# Patient Record
Sex: Male | Born: 1990 | Race: Black or African American | Hispanic: No | Marital: Single | Smoking: Former smoker
Health system: Southern US, Community
[De-identification: ages and names within clinical notes are randomized; demographics above are authoritative.]

## PROBLEM LIST (undated history)

## (undated) DIAGNOSIS — J302 Other seasonal allergic rhinitis: Secondary | ICD-10-CM

---
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
## 2013-10-19 ENCOUNTER — Encounter (HOSPITAL_COMMUNITY): Payer: Self-pay | Admitting: Emergency Medicine

## 2013-10-19 ENCOUNTER — Emergency Department (HOSPITAL_COMMUNITY)
Admission: EM | Admit: 2013-10-19 | Discharge: 2013-10-19 | Disposition: A | Discharge status: Home / Self Care | Payer: Self-pay | Attending: Emergency Medicine | Admitting: Emergency Medicine

## 2013-10-19 DIAGNOSIS — F17200 Nicotine dependence, unspecified, uncomplicated: Secondary | ICD-10-CM | POA: Insufficient documentation

## 2013-10-19 DIAGNOSIS — R369 Urethral discharge, unspecified: Secondary | ICD-10-CM | POA: Insufficient documentation

## 2013-10-19 DIAGNOSIS — R300 Dysuria: Secondary | ICD-10-CM | POA: Insufficient documentation

## 2013-10-19 HISTORY — DX: Other seasonal allergic rhinitis: J30.2

## 2013-10-19 LAB — URINALYSIS, ROUTINE W REFLEX MICROSCOPIC
Bilirubin Urine: NEGATIVE
Glucose, UA: NEGATIVE mg/dL
Hgb urine dipstick: NEGATIVE
Ketones, ur: NEGATIVE mg/dL
Nitrite: NEGATIVE
Protein, ur: NEGATIVE mg/dL
Specific Gravity, Urine: 1.024 (ref 1.005–1.030)
Urobilinogen, UA: 0.2 mg/dL (ref 0.0–1.0)
pH: 6 (ref 5.0–8.0)

## 2013-10-19 LAB — URINE MICROSCOPIC-ADD ON

## 2013-10-19 MED ORDER — AZITHROMYCIN 250 MG PO TABS
1000.0000 mg | ORAL_TABLET | Freq: Once | ORAL | Status: AC
  Administered 2013-10-19: 1000 mg via ORAL
  Filled 2013-10-19: qty 4

## 2013-10-19 MED ORDER — LIDOCAINE HCL (PF) 1 % IJ SOLN
INTRAMUSCULAR | Status: AC
  Administered 2013-10-19: 2.1 mL
  Filled 2013-10-19: qty 5

## 2013-10-19 MED ORDER — CEFTRIAXONE SODIUM 250 MG IJ SOLR
250.0000 mg | Freq: Once | INTRAMUSCULAR | Status: AC
  Administered 2013-10-19: 250 mg via INTRAMUSCULAR
  Filled 2013-10-19: qty 250

## 2013-10-19 NOTE — Discharge Instructions (Signed)
Sexually Transmitted Disease A sexually transmitted disease (STD) is a disease or infection that may be passed (transmitted) from person to person, usually during sexual activity. This may happen by way of saliva, semen, blood, vaginal mucus, or urine. Common STDs include:   Gonorrhea.   Chlamydia.   Syphilis.   HIV and AIDS.   Genital herpes.   Hepatitis B and C.   Trichomonas.   Human papillomavirus (HPV).   Pubic lice.   Scabies.  Mites.  Bacterial vaginosis. WHAT ARE CAUSES OF STDs? An STD may be caused by bacteria, a virus, or parasites. STDs are often transmitted during sexual activity if one person is infected. However, they may also be transmitted through nonsexual means. STDs may be transmitted after:   Sexual intercourse with an infected person.   Sharing sex toys with an infected person.   Sharing needles with an infected person or using unclean piercing or tattoo needles.  Having intimate contact with the genitals, mouth, or rectal areas of an infected person.   Exposure to infected fluids during birth. WHAT ARE THE SIGNS AND SYMPTOMS OF STDs? Different STDs have different symptoms. Some people may not have any symptoms. If symptoms are present, they may include:   Painful or bloody urination.   Pain in the pelvis, abdomen, vagina, anus, throat, or eyes.   Skin rash, itching, irritation, growths, sores (lesions), ulcerations, or warts in the genital or anal area.  Abnormal vaginal discharge with or without bad odor.   Penile discharge in men.   Fever.   Pain or bleeding during sexual intercourse.   Swollen glands in the groin area.   Yellow skin and eyes (jaundice). This is seen with hepatitis.   Swollen testicles.  Infertility.  Sores and blisters in the mouth. HOW ARE STDs DIAGNOSED? To make a diagnosis, your health care provider may:   Take a medical history.   Perform a physical exam.   Take a sample of any  discharge for examination.  Swab the throat, cervix, opening to the penis, rectum, or vagina for testing.  Test a sample of your first morning urine.   Perform blood tests.   Perform a Pap smear, if this applies.   Perform a colposcopy.   Perform a laparoscopy.  HOW ARE STDs TREATED? Treatment depends on the STD. Some STDs may be treated but not cured.   Chlamydia, gonorrhea, trichomonas, and syphilis can be cured with antibiotics.   Genital herpes, hepatitis, and HIV can be treated, but not cured, with prescribed medicines. The medicines lessen symptoms.   Genital warts from HPV can be treated with medicine or by freezing, burning (electrocautery), or surgery. Warts may come back.   HPV cannot be cured with medicine or surgery. However, abnormal areas may be removed from the cervix, vagina, or vulva.   If your diagnosis is confirmed, your recent sexual partners need treatment. This is true even if they are symptom-free or have a negative culture or evaluation. They should not have sex until their health care providers say it is OK. HOW CAN I REDUCE MY RISK OF GETTING AN STD?  Use latex condoms, dental dams, and water-soluble lubricants during sexual activity. Do not use petroleum jelly or oils.  Get vaccinated for HPV and hepatitis. If you have not received these vaccines in the past, talk to your health care provider about whether one or both might be right for you.   Avoid risky sex practices that can break the skin.  WHAT SHOULD   I DO IF I THINK I HAVE AN STD?  See your health care provider.   Inform all sexual partners. They should be tested and treated for any STDs.  Do not have sex until your health care provider says it is OK. WHEN SHOULD I GET HELP? Seek immediate medical care if:  You develop severe abdominal pain.  You are a man and notice swelling or pain in the testicles.  You are a woman and notice swelling or pain in your vagina. Document  Released: 10/26/2002 Document Revised: 05/26/2013 Document Reviewed: 02/23/2013 [employer] Patient Information 2014 [employer].  

## 2013-10-19 NOTE — ED Notes (Signed)
Pt discharged home with all belongings, pt alert and ambulatory upon discharge, no new rx prescribed, pt verbalizes understanding of dc instructions, no narcotics given in ED pt drove self home

## 2013-10-19 NOTE — ED Provider Notes (Signed)
CSN: [id number]     Arrival date & time 10/19/13  1213 History  This chart was scribed for non-physician practitioner, Roxy Horsemanobert Channel Papandrea, PA-C working with Doug SouSam Jacubowitz, MD by Greggory StallionKayla Andersen, ED scribe. This patient was seen in room TR06C/TR06C and the patient's care was started at 12:46 PM.   Chief Complaint  Patient presents with  . Penile Discharge   The history is provided by the patient. No language interpreter was used.   HPI Comments: Lucas Moore is a 23 y.o. male who presents to the Emergency Department complaining of white penile discharge and dysuria that started 3 days ago. Pt was told by his partner that she tested positive for an STD. He has not tried anything for his symptoms. Denies fever, chills, testicular pain.   Past Medical History  Diagnosis Date  . Seasonal allergies    History reviewed. No pertinent past surgical history. No family history on file. History  Substance Use Topics  . Smoking status: Current Every Day Smoker -- 0.25 packs/day    Types: Cigarettes  . Smokeless tobacco: Not on file  . Alcohol Use: Yes     Comment: occasionally    Review of Systems  Constitutional: Negative for fever and chills.  HENT: Negative for congestion.   Eyes: Negative for redness.  Respiratory: Negative for shortness of breath.   Cardiovascular: Negative for chest pain.  Gastrointestinal: Negative for abdominal distention.  Genitourinary: Positive for dysuria and discharge. Negative for testicular pain.  Musculoskeletal: Negative for gait problem.  Neurological: Negative for speech difficulty.  Psychiatric/Behavioral: Negative for confusion.   Allergies  Review of patient's allergies indicates no known allergies.  Home Medications  No current outpatient prescriptions on file.  BP 146/84  Pulse 70  Temp(Src) 97.8 F (36.6 C) (Oral)  Resp 18  Wt 170 lb (77.111 kg)  SpO2 99%  Physical Exam  Nursing note and vitals reviewed. Constitutional: He is oriented  to person, place, and time. He appears well-developed. No distress.  HENT:  Head: Normocephalic and atraumatic.  Eyes: Conjunctivae and EOM are normal.  Cardiovascular: Normal rate and regular rhythm.   Pulmonary/Chest: Effort normal. No stridor. No respiratory distress.  Abdominal: He exhibits no distension.  Genitourinary:  Uncircumcised, no lesions, lumps, or masses of the penis or testicles. Thick, white penile discharge. No other abnormality of the penis or scrotum.    Musculoskeletal: He exhibits no edema.  Neurological: He is alert and oriented to person, place, and time.  Skin: Skin is warm and dry.  Psychiatric: He has a normal mood and affect.    ED Course  Procedures (including critical care time)  DIAGNOSTIC STUDIES: Oxygen Saturation is 99% on RA, normal by my interpretation.    COORDINATION OF CARE: 12:46 PM-Discussed treatment plan which includes STD testing and treatment with pt at bedside and pt agreed to plan.   Labs Review Labs Reviewed  URINALYSIS, ROUTINE W REFLEX MICROSCOPIC - Abnormal; Notable for the following:    APPearance CLOUDY (*)    Leukocytes, UA MODERATE (*)    All other components within normal limits  URINE MICROSCOPIC-ADD ON - Abnormal; Notable for the following:    Bacteria, UA FEW (*)    All other components within normal limits  GC/CHLAMYDIA PROBE AMP   Imaging Review No results found.   EKG Interpretation None      MDM   Final diagnoses:  Penile discharge    Patient with penile discharge.  No ttp of scrotum or testicles.  Treated with azithro and rocephin.  Discharge to home STD precautions and instructions.  I personally performed the services described in this documentation, which was scribed in my presence. The recorded information has been reviewed and is accurate.    Roxy Horseman, PA-C 10/19/13 1425

## 2013-10-19 NOTE — ED Provider Notes (Signed)
Medical screening examination/treatment/procedure(s) were performed by non-physician practitioner and as supervising physician I was immediately available for consultation/collaboration.   EKG Interpretation None       Doug SouSam Julius Matus, MD 10/19/13 1627

## 2013-10-19 NOTE — ED Notes (Signed)
Pt c/o milky white penile discharge X 3 days, was told by his partner that she was tested for STD and she has something. also c/o discomfort with urination. Denies abd pain. Nad, skin warm and dry, resp e/u.

## 2013-10-20 LAB — GC/CHLAMYDIA PROBE AMP
CT Probe RNA: NEGATIVE
GC Probe RNA: POSITIVE — AB

## 2013-10-21 ENCOUNTER — Telehealth (HOSPITAL_COMMUNITY): Payer: Self-pay

## 2013-10-21 NOTE — ED Notes (Signed)
Results received from [facility].  (+) Gonorrhea. Treated with Zithromax and Rocephin.  [employer] form completed and faxed.  10/21/13 @ 9:20 Pt informed of dx, tx rcvd approp. notify partner(s) for testing and tx and abstain from sex x 2 wks.

## 2017-09-16 ENCOUNTER — Emergency Department (HOSPITAL_COMMUNITY)
Admission: EM | Admit: 2017-09-16 | Discharge: 2017-09-16 | Disposition: A | Discharge status: Home / Self Care | Payer: Self-pay | Attending: Emergency Medicine | Admitting: Emergency Medicine

## 2017-09-16 ENCOUNTER — Other Ambulatory Visit: Payer: Self-pay

## 2017-09-16 ENCOUNTER — Encounter (HOSPITAL_COMMUNITY): Payer: Self-pay | Admitting: Emergency Medicine

## 2017-09-16 ENCOUNTER — Emergency Department (HOSPITAL_COMMUNITY): Payer: Self-pay

## 2017-09-16 DIAGNOSIS — F17210 Nicotine dependence, cigarettes, uncomplicated: Secondary | ICD-10-CM | POA: Insufficient documentation

## 2017-09-16 DIAGNOSIS — R410 Disorientation, unspecified: Secondary | ICD-10-CM | POA: Insufficient documentation

## 2017-09-16 LAB — CBC
HEMATOCRIT: 46.5 % (ref 39.0–52.0)
HEMOGLOBIN: 15.6 g/dL (ref 13.0–17.0)
MCH: 27.9 pg (ref 26.0–34.0)
MCHC: 33.5 g/dL (ref 30.0–36.0)
MCV: 83.2 fL (ref 78.0–100.0)
Platelets: 201 10*3/uL (ref 150–400)
RBC: 5.59 MIL/uL (ref 4.22–5.81)
RDW: 14.7 % (ref 11.5–15.5)
WBC: 10.2 10*3/uL (ref 4.0–10.5)

## 2017-09-16 LAB — COMPREHENSIVE METABOLIC PANEL
ALBUMIN: 4.7 g/dL (ref 3.5–5.0)
ALT: 21 U/L (ref 17–63)
ANION GAP: 9 (ref 5–15)
AST: 28 U/L (ref 15–41)
Alkaline Phosphatase: 48 U/L (ref 38–126)
BUN: 13 mg/dL (ref 6–20)
CHLORIDE: 104 mmol/L (ref 101–111)
CO2: 25 mmol/L (ref 22–32)
Calcium: 9.7 mg/dL (ref 8.9–10.3)
Creatinine, Ser: 1.55 mg/dL — ABNORMAL HIGH (ref 0.61–1.24)
GFR calc Af Amer: >60 mL/min (ref >60)
GFR calc non Af Amer: 59 mL/min — ABNORMAL LOW (ref >60)
GLUCOSE: 102 mg/dL — AB (ref 65–99)
POTASSIUM: 4.1 mmol/L (ref 3.5–5.1)
SODIUM: 138 mmol/L (ref 135–145)
Total Bilirubin: 1 mg/dL (ref 0.3–1.2)
Total Protein: 7.4 g/dL (ref 6.5–8.1)

## 2017-09-16 LAB — URINALYSIS, ROUTINE W REFLEX MICROSCOPIC
BILIRUBIN URINE: NEGATIVE
Glucose, UA: NEGATIVE mg/dL
HGB URINE DIPSTICK: NEGATIVE
Ketones, ur: NEGATIVE mg/dL
Leukocytes, UA: NEGATIVE
NITRITE: NEGATIVE
PROTEIN: NEGATIVE mg/dL
Specific Gravity, Urine: 1.019 (ref 1.005–1.030)
pH: 5 (ref 5.0–8.0)

## 2017-09-16 LAB — RAPID URINE DRUG SCREEN, HOSP PERFORMED
AMPHETAMINES: NOT DETECTED
Barbiturates: NOT DETECTED
Benzodiazepines: POSITIVE — AB
Cocaine: NOT DETECTED
Opiates: NOT DETECTED
TETRAHYDROCANNABINOL: POSITIVE — AB

## 2017-09-16 LAB — AMMONIA: Ammonia: 26 umol/L (ref 9–35)

## 2017-09-16 LAB — ETHANOL: Alcohol, Ethyl (B): <10 mg/dL (ref <10)

## 2017-09-16 LAB — INFLUENZA PANEL BY PCR (TYPE A & B)
Influenza A By PCR: NEGATIVE
Influenza B By PCR: NEGATIVE

## 2017-09-16 LAB — RAPID STREP SCREEN (MED CTR MEBANE ONLY): Streptococcus, Group A Screen (Direct): NEGATIVE

## 2017-09-16 MED ORDER — SODIUM CHLORIDE 0.9 % IV BOLUS (SEPSIS)
1000.0000 mL | Freq: Once | INTRAVENOUS | Status: AC
  Administered 2017-09-16: 1000 mL via INTRAVENOUS

## 2017-09-16 NOTE — ED Notes (Signed)
Pt taken to CT.

## 2017-09-16 NOTE — ED Notes (Signed)
Pt and visitor arguing in the hall, advised that other pt's are here and they need to be quiet. Pt becoming increasingly agitated, security and [employer] present. Pt and visitor exited Pod A to the waiting room.

## 2017-09-16 NOTE — ED Provider Notes (Signed)
[facility] EMERGENCY DEPARTMENT Provider Note   CSN: [id number] Arrival date & time: 09/16/17  0241     History   Chief Complaint Chief Complaint  Patient presents with  . Altered Mental Status   Level 5 caveat due to altered mental status HPI Lucas Moore is a 27 y.o. male with no significant past medical history presents today for evaluation of altered mental status.  He was brought in by his girlfriend who states that she was unable to get a hold of him all day yesterday (Monday).  She states that they spoke on the phone Sunday night into Monday morning from around 2 AM to 5 AM.  She states that she then went to sleep and awoke at around 11 PM and was unable to get a hold of the patient.  She went over to his home at around 2 AM and was let in by his roommates and was found to be sleeping in his bed.  She states that he was "staggering around and slurring his speech "which concerned her.  He told her that he had one beer but denied any other alcohol or drug use.  She states that she last saw him normal Sunday evening.  Patient is complaining of a headache and sore throat but is unable to tell me how long this is been going on for.  He states "I have headaches a lot ".  He states he took an ibuprofen hours ago with some relief of his symptoms.  He denies any recreational drug use but endorses drinking 1 beer.  Denies any recent travel.  She is girlfriend states that she is unsure if he had taken any other drugs.  The history is provided by the patient.    Past Medical History:  Diagnosis Date  . Seasonal allergies     There are no active problems to display for this patient.   History reviewed. No pertinent surgical history.     Home Medications    Prior to Admission medications   Medication Sig Start Date End Date Taking? Authorizing Provider  ibuprofen (ADVIL,MOTRIN) 200 MG tablet Take 400 mg by mouth every 6 (six) hours as needed for mild pain.   Yes  [provider]    Family History No family history on file.  Social History Social History   Tobacco Use  . Smoking status: Current Every Day Smoker    Packs/day: 0.25    Types: Cigarettes  . Smokeless tobacco: Never Used  Substance Use Topics  . Alcohol use: Yes    Comment: occasionally  . Drug use: Yes    Types: Marijuana    Comment: occasionally     Allergies   Patient has no known allergies.   Review of Systems Review of Systems  Constitutional: Negative for chills and fever.  HENT: Positive for sore throat.   Respiratory: Negative for shortness of breath.   Cardiovascular: Negative for chest pain.  Gastrointestinal: Negative for abdominal pain.  Neurological: Positive for headaches. Negative for syncope, weakness and numbness.  Psychiatric/Behavioral: Positive for confusion.  All other systems reviewed and are negative.    Physical Exam Updated Vital Signs BP (!) 147/106   Pulse 88   Temp 98.6 F (37 C) (Oral)   Resp 14   SpO2 96%   Physical Exam  Constitutional: He appears well-developed and well-nourished. No distress.  Resting in bed, appears somewhat sleepy  HENT:  Head: Normocephalic and atraumatic.  TMs without erythema or  bulging bilaterally.  No frontal or maxillary sinus tenderness.  Nasal septum midline.  Posterior oropharynx somewhat difficult to visualize but uvula is midline.  There is mild tonsillar erythema but no significant hypertrophy or exudates.  No facial swelling noted.  No drooling.  Eyes: EOM are normal. Pupils are equal, round, and reactive to light. Right eye exhibits no discharge. Left eye exhibits no discharge.  Mildly injected conjunctiva bilaterally  Neck: Normal range of motion. Neck supple. No JVD present. No tracheal deviation present.  Cardiovascular: Regular rhythm and normal heart sounds.  Tachycardic, 2+ radial and DP/PT pulses bl, negative Homan's bl , no lower extremity edema  Pulmonary/Chest: Effort  normal and breath sounds normal.  Abdominal: Soft. Bowel sounds are normal. He exhibits no distension. There is no tenderness. There is no guarding.  Musculoskeletal: Normal range of motion. He exhibits no edema or tenderness.  No midline spine TTP, no paraspinal muscle tenderness, no deformity, crepitus, or step-off noted.  5/5 strength of BUE and BLE major muscle groups.  No deformity, crepitus, erythema, or swelling noted on palpation of the extremities.  Neurological: He is alert. No cranial nerve deficit or sensory deficit. Coordination abnormal.  Mildly dysarthric speech.  Alert and oriented to person and place but not time. No facial droop.  No nystagmus.  No pronator drift. Cranial nerves II through XII tested and intact. 5/5 strength of BUE and BLE major muscle groups.  He is exhibiting good muscle tone.  Sensation intact to soft touch of extremities and face. Unsteady gait, patient unable to Heel Walk and Toe Walk.  Romberg sign present. No Clonus, no hyperreflexia.   Skin: Skin is warm and dry. No erythema.  Psychiatric: He has a normal mood and affect. His behavior is normal.  Nursing note and vitals reviewed.    ED Treatments / Results  Labs (all labs ordered are listed, but only abnormal results are displayed) Labs Reviewed  COMPREHENSIVE METABOLIC PANEL - Abnormal; Notable for the following components:      Result Value   Glucose, Bld 102 (*)    Creatinine, Ser 1.55 (*)    GFR calc non Af Amer 59 (*)    All other components within normal limits  RAPID URINE DRUG SCREEN, HOSP PERFORMED - Abnormal; Notable for the following components:   Benzodiazepines POSITIVE (*)    Tetrahydrocannabinol POSITIVE (*)    All other components within normal limits  URINALYSIS, ROUTINE W REFLEX MICROSCOPIC - Abnormal; Notable for the following components:   APPearance TURBID (*)    Bacteria, UA RARE (*)    Squamous Epithelial / LPF 0-5 (*)    All other components within normal limits    RAPID STREP SCREEN (NOT AT [facility])  CULTURE, GROUP A STREP [facility])  CBC  ETHANOL  AMMONIA  INFLUENZA PANEL BY PCR (TYPE A & B)    EKG  EKG Interpretation  Date/Time:  Tuesday September 16 2017 02:46:37 EST Ventricular Rate:  110 PR Interval:  154 QRS Duration: 86 QT Interval:  298 QTC Calculation: 403 R Axis:   27 Text Interpretation:  Sinus tachycardia Cannot rule out Anterior infarct , age undetermined Abnormal ECG No previous ECGs available Confirmed by Alvira Monday ([id number]) on 09/16/2017 6:58:56 AM       Radiology Dg Chest 2 View  Result Date: 09/16/2017 CLINICAL DATA:  Altered mental status and tachypnea EXAM: CHEST  2 VIEW COMPARISON:  None. FINDINGS: Cardiac shadow is enlarged but accentuated by the frontal technique. The lungs are  hypoinflated without focal infiltrate. No sizable effusion is seen. No bony abnormality is noted. IMPRESSION: No acute abnormality noted. Electronically Signed   By: Alcide Clever M.D.   On: 09/16/2017 14:24   Ct Head Wo Contrast  Result Date: 09/16/2017 CLINICAL DATA:  Altered level of consciousness EXAM: CT HEAD WITHOUT CONTRAST TECHNIQUE: Contiguous axial images were obtained from the base of the skull through the vertex without intravenous contrast. COMPARISON:  None. FINDINGS: Brain: No evidence of acute infarction, hemorrhage, hydrocephalus, extra-axial collection or mass lesion/mass effect. Vascular: No hyperdense vessel or unexpected calcification. Skull: Normal. Negative for fracture or focal lesion. Sinuses/Orbits: The visualized paranasal sinuses are essentially clear. The mastoid air cells are unopacified. Other: None. IMPRESSION: Normal head CT. Electronically Signed   By: Charline Bills M.D.   On: 09/16/2017 09:52    Procedures Procedures (including critical care time)  Medications Ordered in ED Medications  sodium chloride 0.9 % bolus 1,000 mL (0 mLs Intravenous Stopped 09/16/17 1136)  sodium chloride 0.9 % bolus 1,000 mL (0 mLs  Intravenous Stopped 09/16/17 1230)     Initial Impression / Assessment and Plan / ED Course  I have reviewed the triage vital signs and the nursing notes.  Pertinent labs & imaging results that were available during my care of the patient were reviewed by me and considered in my medical decision making (see chart for details).     Patient presents with acute onset of altered mental status at some point between the hours of 5 AM Monday morning and 2 AM Tuesday morning.  Low-grade fever initially with improvement while in the ED.  He was also initially tachycardic which improved while in the ED.  He was intermittently tachypneic while in the ED.  Initially speech was dysarthric and he was sleepy but easily arousable.  He was slow to follow commands, but comprehended them without difficulty.  No leukocytosis, mildly elevated creatinine but otherwise lab work reassuring.  Ammonia is within normal limits.  Patient was complaining of pain to his throat but examination is not concerning for strep pharyngitis or PTA.  Airway is intact.  Rapid strep is negative.  Influenza panel is negative.  Undetectable ethanol levels in the ED. Imaging of the head and chest show no acute abnormalities. UDS is positive for benzodiazepines and THC.  11:19 AM On reassessment, patient is sleepy but easily arousable.  Mildly tachypneic while sleeping.  Patient's girlfriend states that he admitted to her that he took "something ", but would not disclose what substance he ingested or how much.  She states he has not expressed feelings of depression or suicidal ideation to her and she does not think that he ingested anything in an attempt to intentionally overdose.  Per RN, patient ripped out his IV earlier and was attempting to ambulate around the room.  A second IV was successfully placed and he is receiving second liter of normal saline. 3:25 PM On reevaluation, patient is alert and oriented.  He is speaking in full sentences  without difficulty and no evidence of dysarthria.  He is ambulatory without difficulty and able to walk on his heels and toes with normal balance.  He states that he had half a bar of Xanax yesterday at home as well as one beer.  He states he also smoked marijuana.  He denies any complaints at this time.  Vital signs are stable and within normal limits.  Suspects transient altered mental status secondary to Xanax use coupled with alcohol and  marijuana use.  Advised patient to use warm water salt gargles, warm teas, throat lozenges for his throat pain.  Discussed indications for return to the ED as well as indications for reevaluation by primary care physician.  Patient and patient's girlfriend verbalized understanding of and agreement with plan and patient is stable for discharge at this time. Final Clinical Impressions(s) / ED Diagnoses   Final diagnoses:  Disorientation    ED Discharge Orders    None       Jeanie SewerFawze, Maytte Jacot A, PA-C 09/16/17 1552    Little, Ambrose Finlandachel Morgan, MD 09/20/17 1318

## 2017-09-16 NOTE — ED Notes (Signed)
PA at the bedside.

## 2017-09-16 NOTE — ED Notes (Signed)
Pt is eating at this time with girlfriends assistance.

## 2017-09-16 NOTE — ED Notes (Signed)
Pt provided with food and drink at bedside.  Will encourage po challenge upon arrival to room from radiology

## 2017-09-16 NOTE — ED Notes (Signed)
Pt ambulated in bedroom with ease. He appeared sleepy at times but not unbalanced or weak. Pt is dressed and ready for discharge.

## 2017-09-16 NOTE — Discharge Instructions (Signed)
Drink plenty of fluids and get plenty of rest.  Continue to be monitored by friends and family for the next 24 hours.  Return to the emergency department if any concerning signs or symptoms develop such as fever, seizures, or worsening mental status.

## 2017-09-16 NOTE — ED Notes (Signed)
Family at bedside made aware to use call light to contact tech/RN when Pt wakes up. She was told that we are encouraging fluids and how well he is able to ambulate. Pt is still sleeping at this time, but his breathing was noticed to be loud and somewhat rapid (22-24 breaths/min).

## 2017-09-16 NOTE — ED Triage Notes (Signed)
Pt BIB girlfriend for AMS. Girlfriend reports that she hasn't been able to get a hold of pt today and that's not normal. Pt admits to drinking 2 beers and smoking weed. Pt answers questions appropriately, slow to respond.

## 2017-09-16 NOTE — ED Notes (Signed)
Patient transported to X-ray 

## 2017-09-16 NOTE — ED Notes (Signed)
Pt admits to this RN and ED Provider that he took "half a bar" of xanax and smoked weed yesterday.  Pt answers questions appropriately.  VSS.

## 2017-09-18 LAB — CULTURE, GROUP A STREP (THRC)

## 2017-11-22 ENCOUNTER — Emergency Department (HOSPITAL_COMMUNITY)
Admission: EM | Admit: 2017-11-22 | Discharge: 2017-11-22 | Disposition: A | Discharge status: Home / Self Care | Payer: Self-pay | Attending: Emergency Medicine | Admitting: Emergency Medicine

## 2017-11-22 ENCOUNTER — Other Ambulatory Visit: Payer: Self-pay

## 2017-11-22 ENCOUNTER — Encounter (HOSPITAL_COMMUNITY): Payer: Self-pay

## 2017-11-22 DIAGNOSIS — E860 Dehydration: Secondary | ICD-10-CM | POA: Insufficient documentation

## 2017-11-22 DIAGNOSIS — R112 Nausea with vomiting, unspecified: Secondary | ICD-10-CM

## 2017-11-22 DIAGNOSIS — Z87891 Personal history of nicotine dependence: Secondary | ICD-10-CM | POA: Insufficient documentation

## 2017-11-22 DIAGNOSIS — R197 Diarrhea, unspecified: Secondary | ICD-10-CM | POA: Insufficient documentation

## 2017-11-22 DIAGNOSIS — R109 Unspecified abdominal pain: Secondary | ICD-10-CM | POA: Insufficient documentation

## 2017-11-22 DIAGNOSIS — Z79899 Other long term (current) drug therapy: Secondary | ICD-10-CM | POA: Insufficient documentation

## 2017-11-22 LAB — COMPREHENSIVE METABOLIC PANEL
ALK PHOS: 45 U/L (ref 38–126)
ALT: 20 U/L (ref 17–63)
ANION GAP: 9 (ref 5–15)
AST: 23 U/L (ref 15–41)
Albumin: 4.8 g/dL (ref 3.5–5.0)
BILIRUBIN TOTAL: 0.9 mg/dL (ref 0.3–1.2)
BUN: 13 mg/dL (ref 6–20)
CALCIUM: 9.8 mg/dL (ref 8.9–10.3)
CO2: 31 mmol/L (ref 22–32)
Chloride: 98 mmol/L — ABNORMAL LOW (ref 101–111)
Creatinine, Ser: 1.36 mg/dL — ABNORMAL HIGH (ref 0.61–1.24)
GFR calc non Af Amer: >60 mL/min (ref >60)
GLUCOSE: 165 mg/dL — AB (ref 65–99)
Potassium: 3.7 mmol/L (ref 3.5–5.1)
Sodium: 138 mmol/L (ref 135–145)
TOTAL PROTEIN: 8 g/dL (ref 6.5–8.1)

## 2017-11-22 LAB — URINALYSIS, ROUTINE W REFLEX MICROSCOPIC
BILIRUBIN URINE: NEGATIVE
Glucose, UA: 100 mg/dL — AB
Hgb urine dipstick: NEGATIVE
KETONES UR: NEGATIVE mg/dL
Leukocytes, UA: NEGATIVE
NITRITE: NEGATIVE
PROTEIN: 30 mg/dL — AB
SPECIFIC GRAVITY, URINE: 1.025 (ref 1.005–1.030)
pH: 7 (ref 5.0–8.0)

## 2017-11-22 LAB — CBC
HCT: 49.1 % (ref 39.0–52.0)
HEMOGLOBIN: 16.9 g/dL (ref 13.0–17.0)
MCH: 28.6 pg (ref 26.0–34.0)
MCHC: 34.4 g/dL (ref 30.0–36.0)
MCV: 83.2 fL (ref 78.0–100.0)
Platelets: 202 10*3/uL (ref 150–400)
RBC: 5.9 MIL/uL — AB (ref 4.22–5.81)
RDW: 14.1 % (ref 11.5–15.5)
WBC: 7.9 10*3/uL (ref 4.0–10.5)

## 2017-11-22 LAB — URINALYSIS, MICROSCOPIC (REFLEX)
Bacteria, UA: NONE SEEN
RBC / HPF: NONE SEEN RBC/hpf (ref 0–5)
WBC UA: NONE SEEN WBC/hpf (ref 0–5)

## 2017-11-22 LAB — LIPASE, BLOOD: Lipase: 22 U/L (ref 11–51)

## 2017-11-22 MED ORDER — ONDANSETRON 4 MG PO TBDP
4.0000 mg | ORAL_TABLET | Freq: Once | ORAL | Status: AC | PRN
  Administered 2017-11-22: 4 mg via ORAL
  Filled 2017-11-22: qty 1

## 2017-11-22 MED ORDER — GI COCKTAIL ~~LOC~~
30.0000 mL | Freq: Once | ORAL | Status: AC
  Administered 2017-11-22: 30 mL via ORAL
  Filled 2017-11-22: qty 30

## 2017-11-22 MED ORDER — SODIUM CHLORIDE 0.9 % IV SOLN
Freq: Once | INTRAVENOUS | Status: AC
Start: 2017-11-22 — End: 2017-11-22
  Administered 2017-11-22: 14:00:00 via INTRAVENOUS

## 2017-11-22 MED ORDER — ONDANSETRON HCL 4 MG/2ML IJ SOLN
4.0000 mg | Freq: Once | INTRAMUSCULAR | Status: AC
  Administered 2017-11-22: 4 mg via INTRAVENOUS
  Filled 2017-11-22: qty 2

## 2017-11-22 MED ORDER — MORPHINE SULFATE (PF) 4 MG/ML IV SOLN
4.0000 mg | Freq: Once | INTRAVENOUS | Status: AC
  Administered 2017-11-22: 4 mg via INTRAVENOUS
  Filled 2017-11-22: qty 1

## 2017-11-22 MED ORDER — ONDANSETRON HCL 4 MG PO TABS: 4.0000 mg | ORAL_TABLET | Freq: Four times a day (QID) | ORAL | 0 refills | Status: DC

## 2017-11-22 NOTE — ED Triage Notes (Signed)
Per EMS-Patient c/o N/v/d x 2 days, and left upper abdominal pain since yesterday.

## 2017-11-22 NOTE — ED Notes (Signed)
I have instructed pt that we need to collect a urine sample and if pt needs to use restroom pt is to inform RN or NT.

## 2017-11-22 NOTE — ED Provider Notes (Signed)
[facility]-EMERGENCY DEPT Provider Note   CSN: [id number] Arrival date & time: 11/22/17  1252     History   Chief Complaint Chief Complaint  Patient presents with  . Emesis  . Diarrhea  . Abdominal Pain    HPI Lucas Moore is a 27 y.o. male.  The history is provided by the patient.  Emesis   This is a new problem. The current episode started 2 days ago. The problem occurs 2 to 4 times per day. The problem has been gradually worsening. The emesis has an appearance of stomach contents. There has been no fever. Associated symptoms include abdominal pain and diarrhea. Risk factors: no ill contacts or bad food intake.  Diarrhea   Associated symptoms include abdominal pain and vomiting.  Abdominal Pain   Associated symptoms include diarrhea and vomiting.    Past Medical History:  Diagnosis Date  . Seasonal allergies     There are no active problems to display for this patient.   History reviewed. No pertinent surgical history.      Home Medications    Prior to Admission medications   Medication Sig Start Date End Date Taking? Authorizing Provider  ibuprofen (ADVIL,MOTRIN) 200 MG tablet Take 400 mg by mouth every 6 (six) hours as needed for mild pain.    [provider]    Family History History reviewed. No pertinent family history.  Social History Social History   Tobacco Use  . Smoking status: Former Smoker    Packs/day: 0.25    Types: Cigarettes  . Smokeless tobacco: Never Used  Substance Use Topics  . Alcohol use: Yes    Comment: occasionally  . Drug use: Yes    Types: Marijuana    Comment: occasionally     Allergies   Patient has no known allergies.   Review of Systems Review of Systems  Gastrointestinal: Positive for abdominal pain, diarrhea and vomiting.  All other systems reviewed and are negative.    Physical Exam Updated Vital Signs BP (!) 152/106 (BP Location: Left Arm)   Pulse 69   Temp 98.4 F  (36.9 C) (Oral)   Resp 18   SpO2 97%   Physical Exam  Constitutional: He is oriented to person, place, and time. He appears well-developed and well-nourished. No distress.  HENT:  Head: Normocephalic and atraumatic.  Mouth/Throat: Oropharynx is clear and moist. Mucous membranes are dry.  Eyes: Pupils are equal, round, and reactive to light. Conjunctivae and EOM are normal.  Neck: Normal range of motion. Neck supple.  Cardiovascular: Normal rate, regular rhythm and intact distal pulses.  No murmur heard. Pulmonary/Chest: Effort normal and breath sounds normal. No respiratory distress. He has no wheezes. He has no rales.  Abdominal: Soft. He exhibits no distension. There is tenderness in the left upper quadrant. There is guarding. There is no rebound.  Musculoskeletal: Normal range of motion. He exhibits no edema or tenderness.  Neurological: He is alert and oriented to person, place, and time.  Skin: Skin is warm and dry. No rash noted. No erythema.  Psychiatric: He has a normal mood and affect. His behavior is normal.  Nursing note and vitals reviewed.    ED Treatments / Results  Labs (all labs ordered are listed, but only abnormal results are displayed) Labs Reviewed  COMPREHENSIVE METABOLIC PANEL - Abnormal; Notable for the following components:      Result Value   Chloride 98 (*)    Glucose, Bld 165 (*)  Creatinine, Ser 1.36 (*)    All other components within normal limits  CBC - Abnormal; Notable for the following components:   RBC 5.90 (*)    All other components within normal limits  LIPASE, BLOOD  URINALYSIS, ROUTINE W REFLEX MICROSCOPIC    EKG None  Radiology No results found.  Procedures Procedures (including critical care time)  Medications Ordered in ED Medications  ondansetron (ZOFRAN-ODT) disintegrating tablet 4 mg (4 mg Oral Given 11/22/17 1309)  ondansetron (ZOFRAN) injection 4 mg (4 mg Intravenous Given 11/22/17 1337)  morphine 4 MG/ML injection 4  mg (4 mg Intravenous Given 11/22/17 1337)  0.9 %  sodium chloride infusion ( Intravenous New Bag/Given 11/22/17 1337)     Initial Impression / Assessment and Plan / ED Course  I have reviewed the triage vital signs and the nursing notes.  Pertinent labs & imaging results that were available during my care of the patient were reviewed by me and considered in my medical decision making (see chart for details).     Patient presenting today with abdominal pain, nausea, vomiting and diarrhea for the last 2-3 days.  Patient is having left upper quadrant pain on exam.  He is having no lower abdominal pain concerning for appendicitis at this time.  He denies any fever.  Patient does drink 1-2 beers at most a day but denies any purge drinking prior to his symptoms starting.  Patient is unable to hold anything down.  Concern for possible pancreatitis versus gastroenteritis versus gastritis.  Low suspicion for cholecystitis or diverticulitis.  Patient given IV fluids, pain and nausea medication.  CBC, CMP, lipase pending  3:02 PM Labs are within normal limits.  Patient's symptoms are improving but still has some mild left upper quadrant pain.  Patient given GI cocktail.  Feel most likely this is viral in nature as patient's son had similar symptoms right before he became ill.  Patient is tolerating p.o.'s.  Final Clinical Impressions(s) / ED Diagnoses   Final diagnoses:  Dehydration  Nausea vomiting and diarrhea    ED Discharge Orders        Ordered    ondansetron (ZOFRAN) 4 MG tablet  Every 6 hours     11/22/17 1504       Gwyneth SproutPlunkett, Kennady Zimmerle, MD 11/22/17 1504

## 2019-04-03 IMAGING — CT CT HEAD W/O CM
3 series · 15 of 47 positions shown, 18 images · non-contrast
Comparison: None.

CLINICAL DATA: Altered level of consciousness

EXAM:
CT HEAD WITHOUT CONTRAST
TECHNIQUE: Contiguous axial images were obtained from the base of the skull
through the vertex without intravenous contrast.

[Series 3: head 5.0 h30s · axial · 0.44mm/px · z∈[-106,+29]mm · 9 of 33 slices shown, 12 images]
[im 3/33  brain]
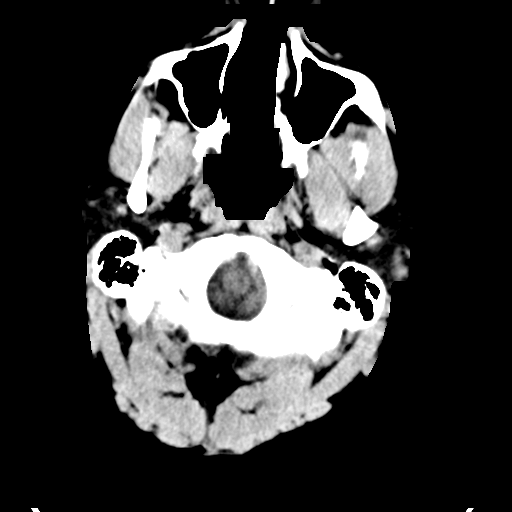
[im 3/33  bone]
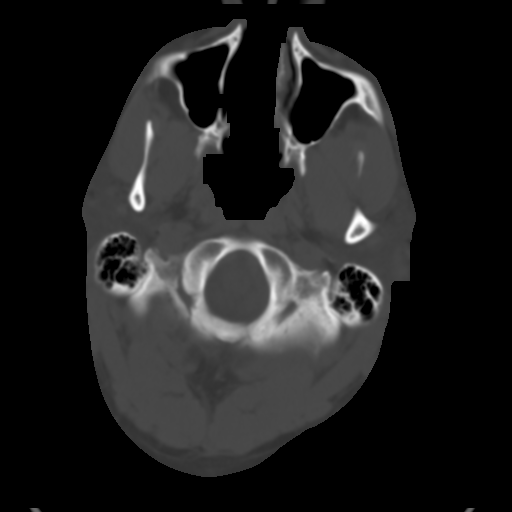
[im 6/33  brain]
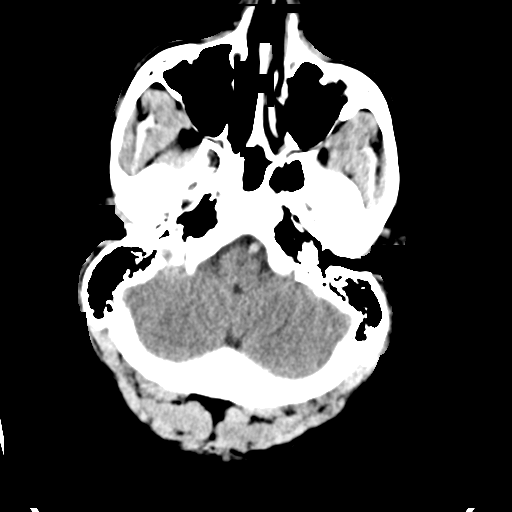
[im 9/33  brain]
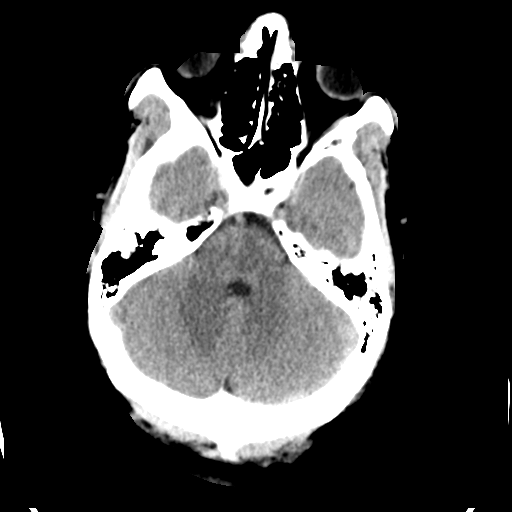
[im 13/33  brain]
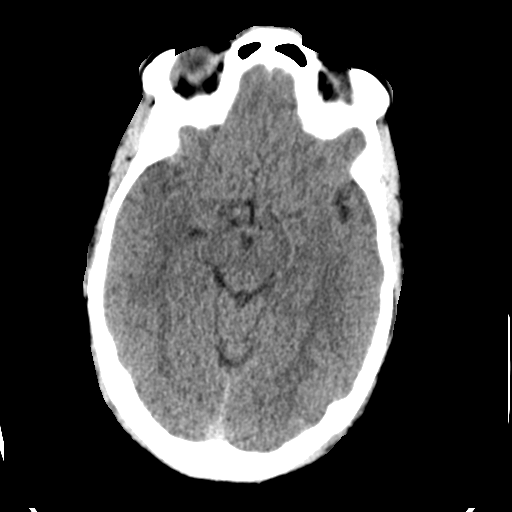
[im 17/33  brain]
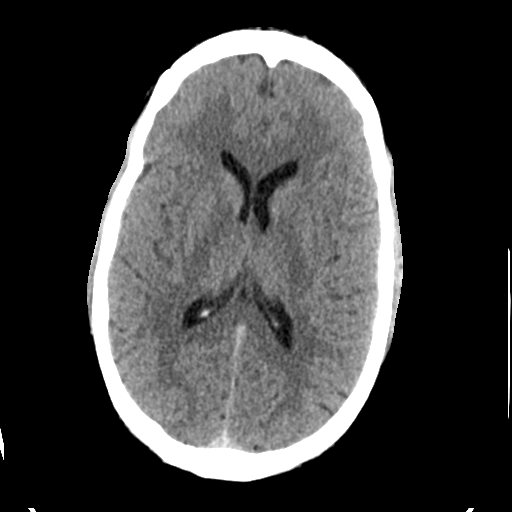
[im 17/33  bone]
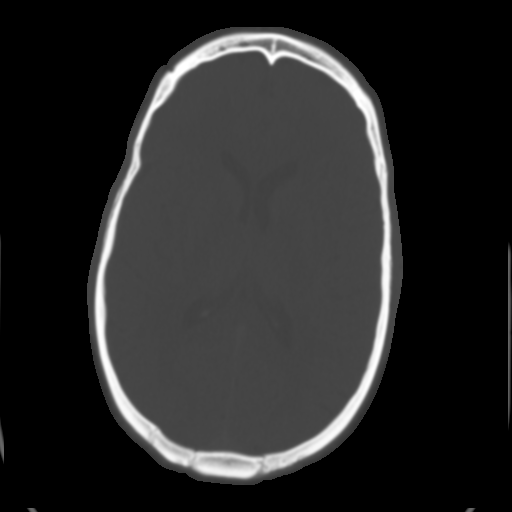
[im 20/33  brain]
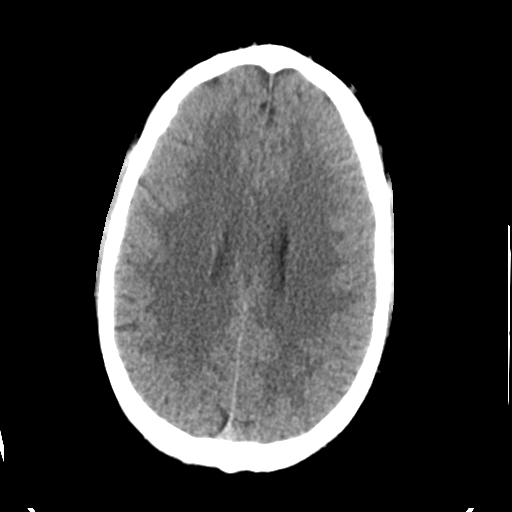
[im 24/33  brain]
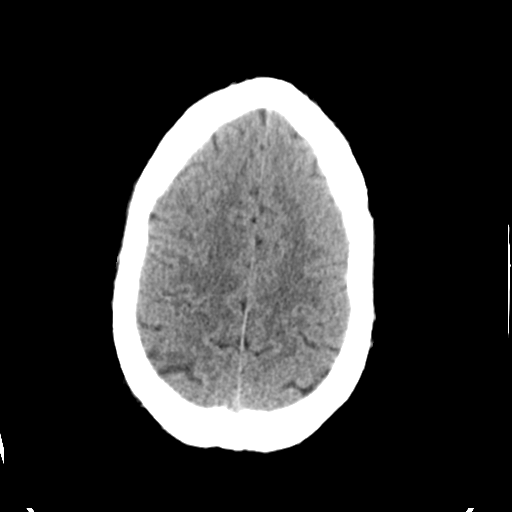
[im 27/33  brain]
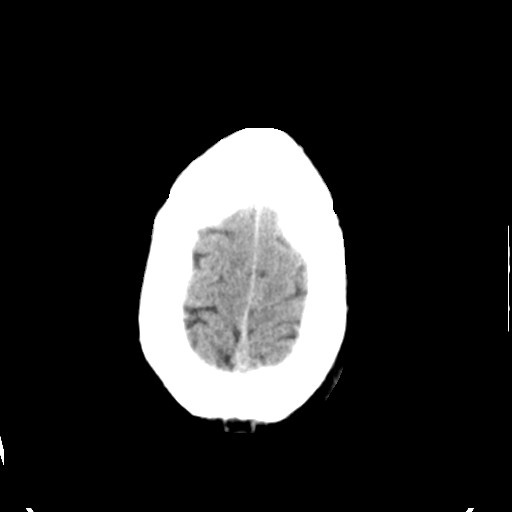
[im 30/33  brain]
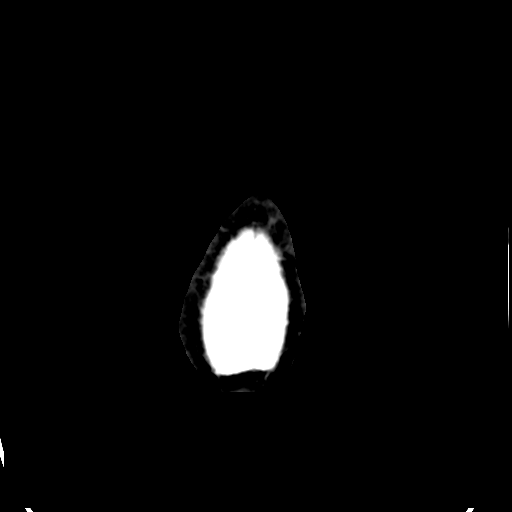
[im 30/33  bone]
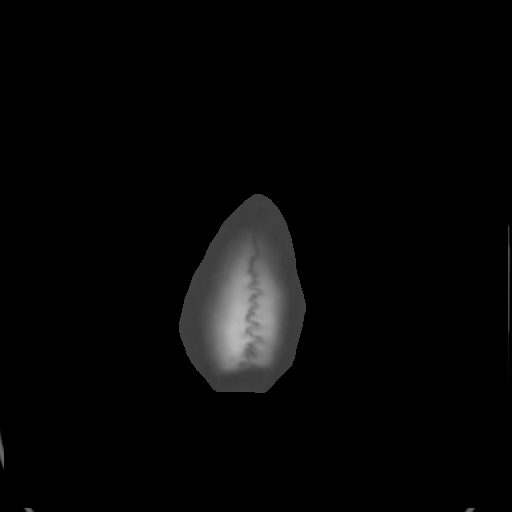

[Series 5: head 3.0 mpr cor · coronal · 0.31mm/px · 3 of 71 slices shown]
[im 24/71  brain]
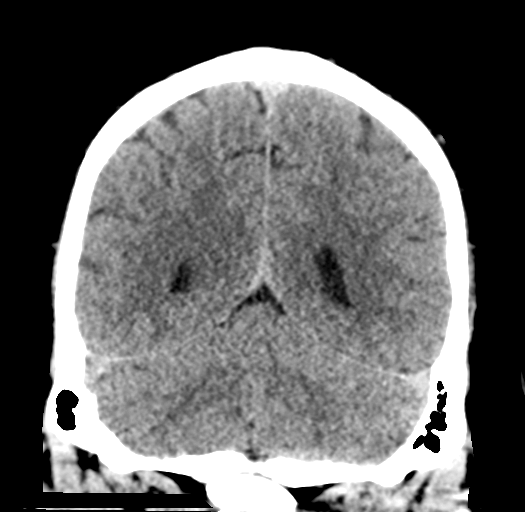
[im 32/71  brain]
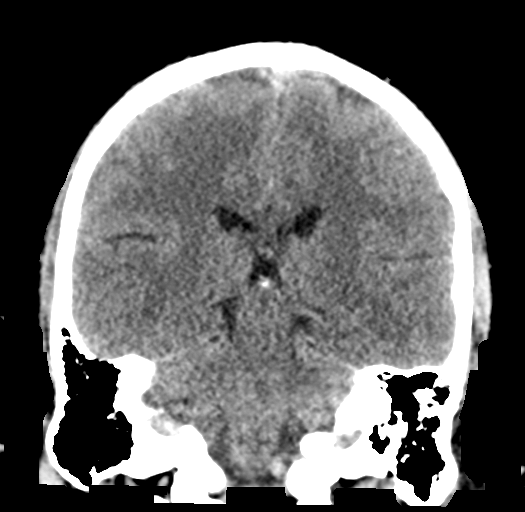
[im 39/71  brain]
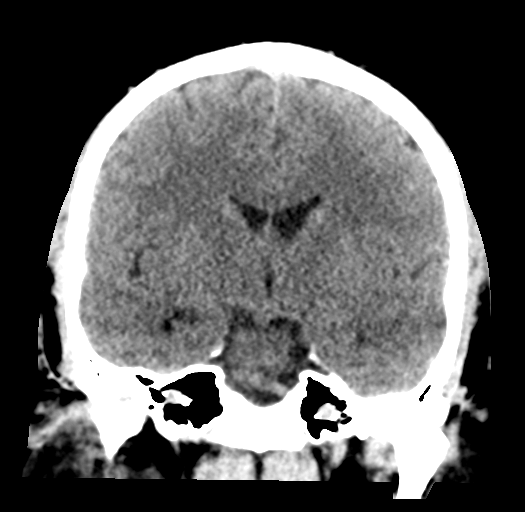

[Series 6: head 3.0 mpr sag · sagittal · 0.31mm/px · 3 of 58 slices shown]
[im 20/58  brain]
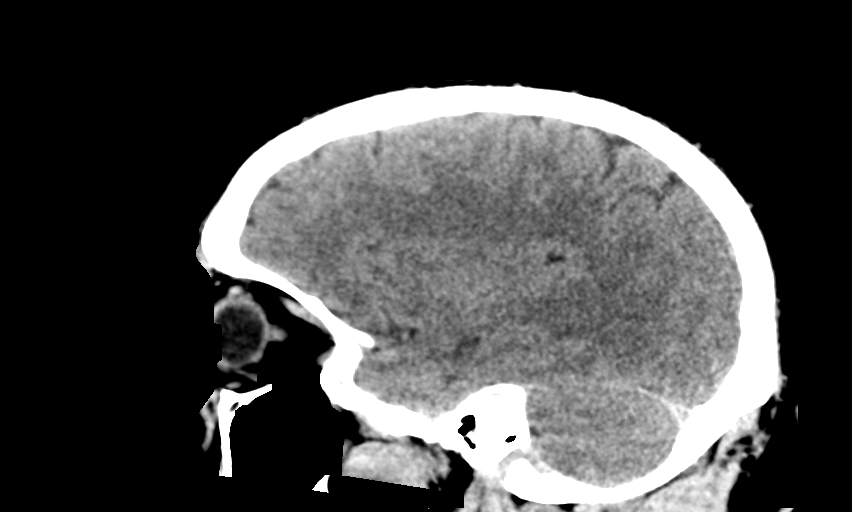
[im 29/58  brain]
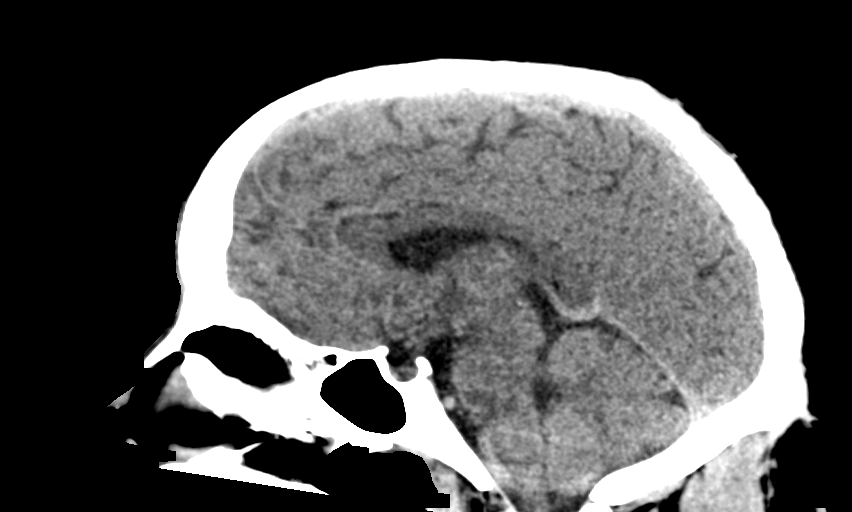
[im 39/58  brain]
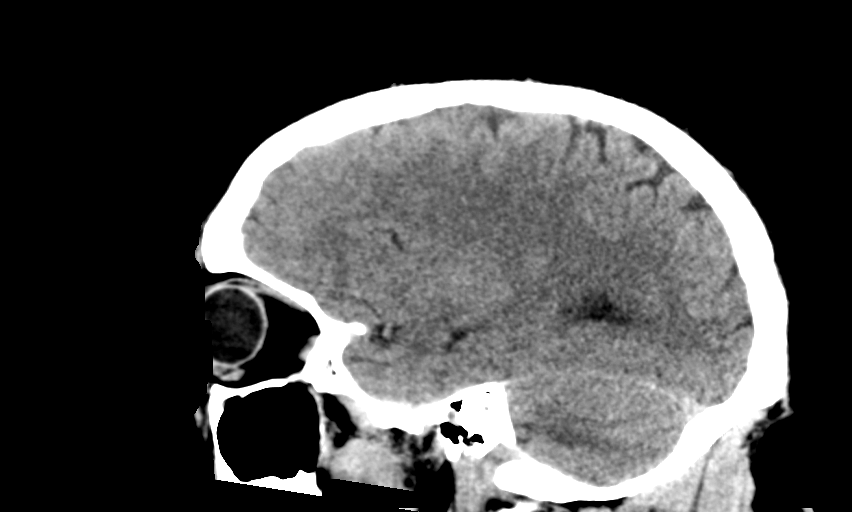

[15 of 47 positions shown; findings below may reference images not displayed]

FINDINGS: Brain: No evidence of acute infarction, hemorrhage, hydrocephalus,
extra-axial collection or mass lesion/mass effect.

Vascular: No hyperdense vessel or unexpected calcification.

Skull: Normal. Negative for fracture or focal lesion.

Sinuses/Orbits: The visualized paranasal sinuses are essentially
clear. The mastoid air cells are unopacified.

Other: None.
IMPRESSION: Normal head CT.

## 2020-08-08 ENCOUNTER — Emergency Department (HOSPITAL_COMMUNITY)
Admission: EM | Admit: 2020-08-08 | Discharge: 2020-08-08 | Disposition: A | Discharge status: Home / Self Care | Payer: Self-pay | Attending: Emergency Medicine | Admitting: Emergency Medicine

## 2020-08-08 ENCOUNTER — Encounter (HOSPITAL_COMMUNITY): Payer: Self-pay | Admitting: Emergency Medicine

## 2020-08-08 ENCOUNTER — Other Ambulatory Visit: Payer: Self-pay

## 2020-08-08 DIAGNOSIS — Z5321 Procedure and treatment not carried out due to patient leaving prior to being seen by health care provider: Secondary | ICD-10-CM | POA: Insufficient documentation

## 2020-08-08 DIAGNOSIS — Z20822 Contact with and (suspected) exposure to covid-19: Secondary | ICD-10-CM | POA: Insufficient documentation

## 2020-08-08 DIAGNOSIS — R6883 Chills (without fever): Secondary | ICD-10-CM | POA: Insufficient documentation

## 2020-08-08 DIAGNOSIS — J029 Acute pharyngitis, unspecified: Secondary | ICD-10-CM | POA: Insufficient documentation

## 2020-08-08 DIAGNOSIS — R112 Nausea with vomiting, unspecified: Secondary | ICD-10-CM | POA: Insufficient documentation

## 2020-08-08 LAB — RESP PANEL BY RT-PCR (FLU A&B, COVID) ARPGX2
Influenza A by PCR: NEGATIVE
Influenza B by PCR: NEGATIVE
SARS Coronavirus 2 by RT PCR: NEGATIVE

## 2020-08-08 LAB — GROUP A STREP BY PCR: Group A Strep by PCR: DETECTED — AB

## 2020-08-08 NOTE — ED Notes (Signed)
Pt called multiple times, no answer 

## 2020-08-08 NOTE — ED Triage Notes (Signed)
Chills, sore throat, nausea/vomiting -- denies covid exposure,  unvaccinated

## 2020-08-23 ENCOUNTER — Encounter (HOSPITAL_COMMUNITY): Payer: Self-pay

## 2020-08-23 ENCOUNTER — Emergency Department (HOSPITAL_COMMUNITY)
Admission: EM | Admit: 2020-08-23 | Discharge: 2020-08-23 | Disposition: A | Discharge status: Home / Self Care | Payer: Self-pay | Attending: Emergency Medicine | Admitting: Emergency Medicine

## 2020-08-23 ENCOUNTER — Other Ambulatory Visit: Payer: Self-pay

## 2020-08-23 DIAGNOSIS — D690 Allergic purpura: Secondary | ICD-10-CM | POA: Insufficient documentation

## 2020-08-23 DIAGNOSIS — Z87891 Personal history of nicotine dependence: Secondary | ICD-10-CM | POA: Insufficient documentation

## 2020-08-23 LAB — CBC WITH DIFFERENTIAL/PLATELET
Abs Immature Granulocytes: 0.02 10*3/uL (ref 0.00–0.07)
Basophils Absolute: 0 10*3/uL (ref 0.0–0.1)
Basophils Relative: 1 %
Eosinophils Absolute: 0.1 10*3/uL (ref 0.0–0.5)
Eosinophils Relative: 1 %
HCT: 44.7 % (ref 39.0–52.0)
Hemoglobin: 14.9 g/dL (ref 13.0–17.0)
Immature Granulocytes: 0 %
Lymphocytes Relative: 39 %
Lymphs Abs: 2.5 10*3/uL (ref 0.7–4.0)
MCH: 27.6 pg (ref 26.0–34.0)
MCHC: 33.3 g/dL (ref 30.0–36.0)
MCV: 82.8 fL (ref 80.0–100.0)
Monocytes Absolute: 0.6 10*3/uL (ref 0.1–1.0)
Monocytes Relative: 9 %
Neutro Abs: 3.2 10*3/uL (ref 1.7–7.7)
Neutrophils Relative %: 50 %
Platelets: 218 10*3/uL (ref 150–400)
RBC: 5.4 MIL/uL (ref 4.22–5.81)
RDW: 14.5 % (ref 11.5–15.5)
WBC: 6.3 10*3/uL (ref 4.0–10.5)
nRBC: 0 % (ref 0.0–0.2)

## 2020-08-23 LAB — URINALYSIS, ROUTINE W REFLEX MICROSCOPIC
Bilirubin Urine: NEGATIVE
Glucose, UA: NEGATIVE mg/dL
Hgb urine dipstick: NEGATIVE
Ketones, ur: NEGATIVE mg/dL
Leukocytes,Ua: NEGATIVE
Nitrite: NEGATIVE
Protein, ur: NEGATIVE mg/dL
Specific Gravity, Urine: 1.021 (ref 1.005–1.030)
pH: 5 (ref 5.0–8.0)

## 2020-08-23 LAB — BASIC METABOLIC PANEL
Anion gap: 10 (ref 5–15)
BUN: 22 mg/dL — ABNORMAL HIGH (ref 6–20)
CO2: 22 mmol/L (ref 22–32)
Calcium: 9.3 mg/dL (ref 8.9–10.3)
Chloride: 106 mmol/L (ref 98–111)
Creatinine, Ser: 1.6 mg/dL — ABNORMAL HIGH (ref 0.61–1.24)
GFR, Estimated: 59 mL/min — ABNORMAL LOW (ref >60)
Glucose, Bld: 128 mg/dL — ABNORMAL HIGH (ref 70–99)
Potassium: 4 mmol/L (ref 3.5–5.1)
Sodium: 138 mmol/L (ref 135–145)

## 2020-08-23 LAB — PROTIME-INR
INR: 0.9 (ref 0.8–1.2)
Prothrombin Time: 12 seconds (ref 11.4–15.2)

## 2020-08-23 LAB — APTT: aPTT: 27 seconds (ref 24–36)

## 2020-08-23 NOTE — Discharge Instructions (Signed)
Take Tylenol or ibuprofen for management of muscle soreness. Drink plenty of fluids to prevent dehydration. Follow up with a primary care doctor in 1 week for recheck. Return for new or concerning symptoms such as profound weakness, difficulty walking, fever, abdominal pain, blood in your urine.

## 2020-08-23 NOTE — ED Triage Notes (Signed)
Patient arrived stating last night he developed a rash on his arms and legs with some itching. Reports changing the hype of soap a few days ago.

## 2020-08-23 NOTE — ED Provider Notes (Signed)
[facility]-EMERGENCY DEPT Provider Note   CSN: [id number] Arrival date & time: 08/23/20  0106     History Chief Complaint  Patient presents with  . Rash    Lucas Moore is a 30 y.o. male.   30 year old male with no significant past medical history presents to the emergency department for evaluation of a rash.  He states that he noticed the rash last night when he was closing up after work.  It is present mostly on his lower legs as well as to his right arm.  Triage note references itching.  He does report some mild pruritus to his thighs, but complains mostly of some soreness to his bilateral lower extremities.  Started using a new soap 3 days ago without other new topical contacts or ingestions.  Of note, was sick with strep throat on 08/08/2020.  No associated fevers since onset of rash.       Past Medical History:  Diagnosis Date  . Seasonal allergies     There are no problems to display for this patient.   History reviewed. No pertinent surgical history.     No family history on file.  Social History   Tobacco Use  . Smoking status: Former Smoker    Packs/day: 0.25    Types: Cigarettes  . Smokeless tobacco: Never Used  Vaping Use  . Vaping Use: Never used  Substance Use Topics  . Alcohol use: Yes    Comment: occasionally  . Drug use: Yes    Types: Marijuana    Comment: occasionally    Home Medications Prior to Admission medications   Medication Sig Start Date End Date Taking? Authorizing Provider  ibuprofen (ADVIL,MOTRIN) 200 MG tablet Take 200 mg by mouth every 6 (six) hours as needed for mild pain.     [provider]  ondansetron (ZOFRAN) 4 MG tablet Take 1 tablet (4 mg total) by mouth every 6 (six) hours. 11/22/17   Gwyneth Sprout, MD    Allergies    Patient has no known allergies.  Review of Systems   Review of Systems  Ten systems reviewed and are negative for acute change, except as noted in the HPI.     Physical Exam Updated Vital Signs BP 122/85 (BP Location: Right Arm)   Pulse 91   Temp 98.7 F (37.1 C) (Oral)   Resp 16   SpO2 100%   Physical Exam Vitals and nursing note reviewed.  Constitutional:      General: He is not in acute distress.    Appearance: He is well-developed and well-nourished. He is not diaphoretic.     Comments: Nontoxic appearing and in NAD  HENT:     Head: Normocephalic and atraumatic.  Eyes:     General: No scleral icterus.    Extraocular Movements: EOM normal.     Conjunctiva/sclera: Conjunctivae normal.  Cardiovascular:     Rate and Rhythm: Normal rate and regular rhythm.     Pulses: Normal pulses.  Pulmonary:     Effort: Pulmonary effort is normal. No respiratory distress.     Comments: Respirations even and unlabored Musculoskeletal:        General: Normal range of motion.     Cervical back: Normal range of motion.  Skin:    General: Skin is warm and dry.     Coloration: Skin is not pale.     Findings: Rash present. No erythema.     Comments: Palpable purpura focused mainly to BLE, but  does scatter to lower back and is present on right forearm. No drainage, induration, pruritis.   Neurological:     Mental Status: He is alert and oriented to person, place, and time.  Psychiatric:        Mood and Affect: Mood and affect normal.        Behavior: Behavior normal.     ED Results / Procedures / Treatments   Labs (all labs ordered are listed, but only abnormal results are displayed) Labs Reviewed  BASIC METABOLIC PANEL - Abnormal; Notable for the following components:      Result Value   Glucose, Bld 128 (*)    BUN 22 (*)    Creatinine, Ser 1.60 (*)    GFR, Estimated 59 (*)    All other components within normal limits  CBC WITH DIFFERENTIAL/PLATELET  PROTIME-INR  APTT  URINALYSIS, ROUTINE W REFLEX MICROSCOPIC    EKG None  Radiology No results found.  Procedures Procedures (including critical care time)  Medications  Ordered in ED Medications - No data to display  ED Course  I have reviewed the triage vital signs and the nursing notes.  Pertinent labs & imaging results that were available during my care of the patient were reviewed by me and considered in my medical decision making (see chart for details).  Clinical Course as of 08/23/20 0604  Wed Aug 23, 2020  6144 Clinically appears to have a purpuric rash. Wish recent dx of strep on 08/08/20, symptoms appear most c/w post-infectious IgAV. Also noting arthraligas, soreness in legs. Will obtain labs and coagulation studies to assess for abnormalities.  [KH]  0503 Normal platelets with normal PT/INR, PTT; vasculitis unlikely ITP/TTP. Pending creatinine and UA. [KH]  0515 No hematuria or proteinuria. Creatinine is elevated, but stable compared to 2 years ago. [KH]    Clinical Course User Index [KH] Darylene Price   MDM Rules/Calculators/A&P                          30 year old male presents to the emergency department for a rash.  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  His labs today are reassuring and consistent with baseline from 2 years ago.  Have discussed outpatient supportive care as well as primary care follow-up within the week.  He has been given strict return precautions.  Discharged in stable condition with no unaddressed concerns.   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

## 2023-05-03 ENCOUNTER — Other Ambulatory Visit: Payer: Self-pay

## 2023-05-03 ENCOUNTER — Emergency Department
Admission: EM | Admit: 2023-05-03 | Discharge: 2023-05-03 | Disposition: A | Discharge status: Home / Self Care | Payer: No Typology Code available for payment source | Attending: Student in an Organized Health Care Education/Training Program | Admitting: Student in an Organized Health Care Education/Training Program

## 2023-05-03 DIAGNOSIS — K0889 Other specified disorders of teeth and supporting structures: Secondary | ICD-10-CM | POA: Diagnosis present

## 2023-05-03 MED ORDER — AMOXICILLIN 875 MG PO TABS: 875.0000 mg | ORAL_TABLET | Freq: Two times a day (BID) | ORAL | 0 refills | Status: DC

## 2023-05-03 MED ORDER — HYDROCODONE-ACETAMINOPHEN 5-325 MG PO TABS: 1.0000 | ORAL_TABLET | ORAL | 0 refills | Status: DC | PRN

## 2023-05-03 NOTE — Discharge Instructions (Signed)
Please follow-up with dentist provided to you.  Take naproxen for pain as needed.  Orajel from over-the-counter.

## 2023-05-03 NOTE — ED Notes (Signed)
Discharge instructions reviewed with patient. Patient questions answered and opportunity for education reviewed. Patient voices understanding of discharge instructions with no further questions. Patient ambulatory with steady gait to lobby.

## 2023-05-03 NOTE — ED Provider Notes (Signed)
[facility] Emergency Department Provider Note     Event Date/Time   First MD Initiated Contact with Patient 05/03/23 1901     (approximate)   History   Dental Pain  HPI  Lucas Moore is a 32 y.o. male presents to the ED with complaint of dental pain x 1 week.  Patient reports possible cracked tooth to the upper right wisdom tooth.  Stated moderate pain that is worse with air.  Denies fever and chills.  No other complaints at this time.    Physical Exam   Triage Vital Signs: ED Triage Vitals [05/03/23 1754]  Encounter Vitals Group     BP (!) 155/121     Systolic BP Percentile      Diastolic BP Percentile      Pulse Rate 97     Resp 18     Temp 98 F (36.7 C)     Temp src      SpO2 100 %     Weight      Height      Head Circumference      Peak Flow      Pain Score 10     Pain Loc      Pain Education      Exclude from Growth Chart     Most recent vital signs: Vitals:   05/03/23 1754 05/03/23 2005  BP: (!) 155/121 (!) 161/97  Pulse: 97 88  Resp: 18 16  Temp: 98 F (36.7 C) 98.2 F (36.8 C)  SpO2: 100% 99%    General Awake, no distress.  HEENT NCAT. PERRL. EOMI. No rhinorrhea. Mucous membranes are moist.  CV:  Good peripheral perfusion.  RESP:  Normal effort.  ABD:  No distention.  Other:  Third molar on right upper jaw reveals to be broken.  No erythema or edema noted.  No abscess noted.  ED Results / Procedures / Treatments   Labs (all labs ordered are listed, but only abnormal results are displayed) Labs Reviewed - No data to display  PROCEDURES:  Critical Care performed: No  Procedures  MEDICATIONS ORDERED IN ED: Medications - No data to display  IMPRESSION / MDM / ASSESSMENT AND PLAN / ED COURSE  I reviewed the triage vital signs and the nursing notes.                               32 y.o. male presents to the emergency department for evaluation and treatment of dental pain. See HPI for further details.    Differential diagnosis includes, but is not limited to pain due to dental caries, dental abscess, broken tooth, Wisdom tooth  Patient's presentation is most consistent with acute complicated illness / injury requiring diagnostic workup.  Patient is alert and oriented.  He is nontoxic.  Oral exam is overall benign.  There is a broken tooth of the right upper jaw over third molar.  No surrounding edema or erythema indicating an infection.  I reviewed of the [place] CSRS was performed in accordance of the [facility] prior to dispensing any controlled drugs.  It is given pain medication and antibiotics.  Encourage patient to follow-up with dentist.  A list has been provided to him.  ED precautions discussed.  FINAL CLINICAL IMPRESSION(S) / ED DIAGNOSES   Final diagnoses:  Pain, dental    Rx / DC Orders   ED Discharge Orders  Ordered    amoxicillin (AMOXIL) 875 MG tablet  2 times daily        05/03/23 2001    HYDROcodone-acetaminophen (NORCO/VICODIN) 5-325 MG tablet  Every 4 hours PRN        05/03/23 2001             Note:  This document was prepared using Dragon voice recognition software and may include unintentional dictation errors.    Romeo Apple, Alexandro Line A, PA-C 05/04/23 0000    Willy Eddy, MD 05/07/23 [phone]

## 2023-05-03 NOTE — ED Triage Notes (Signed)
Pt comes with c.o wisdom tooth pain for several days. Pt denies any fevers. Pt states right side upper

## 2023-10-07 ENCOUNTER — Encounter: Payer: Self-pay | Admitting: Family Medicine

## 2023-10-07 ENCOUNTER — Ambulatory Visit: Payer: No Typology Code available for payment source | Admitting: Family Medicine

## 2023-10-07 DIAGNOSIS — N341 Nonspecific urethritis: Secondary | ICD-10-CM

## 2023-10-07 DIAGNOSIS — Z113 Encounter for screening for infections with a predominantly sexual mode of transmission: Secondary | ICD-10-CM

## 2023-10-07 LAB — GRAM STAIN

## 2023-10-07 MED ORDER — DOXYCYCLINE HYCLATE 100 MG PO TABS: 100.0000 mg | ORAL_TABLET | Freq: Two times a day (BID) | ORAL | Status: AC

## 2023-10-07 NOTE — Progress Notes (Signed)
[facility] Department STI clinic [address], [address] [place] [address] Main phone: [phone]  STI screening visit  Subjective:  Lucas Moore is a 33 y.o. male being seen today for an STI screening visit. The patient reports they do have symptoms.    Patient has the following medical conditions:  There are no active problems to display for this patient.   Chief Complaint  Patient presents with   SEXUALLY TRANSMITTED DISEASE    Pt is here for STD screening and having symptoms     HPI HPI Patient reports to clinic with a history of penile discharge that is clear. Reports "it has been this way for years, they never find anything".   STI screening history: Last HIV test per patient/review of record was No results found for: "HMHIVSCREEN" No results found for: "HIV"  Last HEPC test per patient/review of record was No results found for: "HMHEPCSCREEN" No components found for: "HEPC"   Last HEPB test per patient/review of record was No components found for: "HMHEPBSCREEN"   Fertility: Does the patient or their partner desires a pregnancy in the next year? No  Screening for MPX risk: Does the patient have an unexplained rash? No Is the patient MSM? No Does the patient endorse multiple sex partners or anonymous sex partners? No Did the patient have close or sexual contact with a person diagnosed with MPX? No Has the patient traveled outside the [place] where MPX is endemic? No Is there a high clinical suspicion for MPX-- evidenced by one of the following No  -Unlikely to be chickenpox  -Lymphadenopathy  -Rash that present in same phase of evolution on any given body part   See flowsheet for further details and programmatic requirements.    There is no immunization history on file for this patient.   The following portions of the patient's history were reviewed and updated as appropriate: allergies, current medications, past medical history,  past social history, past surgical history and problem list.  Objective:  There were no vitals filed for this visit.  Physical Exam Constitutional:      Appearance: Normal appearance.  HENT:     Head: Normocephalic and atraumatic.     Comments: No nits or hair loss    Mouth/Throat:     Mouth: Mucous membranes are moist. No oral lesions.     Pharynx: Oropharynx is clear. No oropharyngeal exudate or posterior oropharyngeal erythema.  Eyes:     General:        Right eye: No discharge.        Left eye: No discharge.     Conjunctiva/sclera:     Right eye: Right conjunctiva is not injected. No exudate.    Left eye: Left conjunctiva is not injected. No exudate. Pulmonary:     Effort: Pulmonary effort is normal.  Abdominal:     General: Abdomen is flat.     Palpations: Abdomen is soft. There is no hepatomegaly or mass.     Tenderness: There is no abdominal tenderness. There is no rebound.     Hernia: There is no hernia in the left inguinal area or right inguinal area.  Genitourinary:    Pubic Area: No rash or pubic lice (no nits).      Penis: Normal and uncircumcised. No tenderness, discharge, swelling or lesions.      Testes: Normal.     Epididymis:     Right: Normal. No mass or tenderness.     Left:  Normal. No mass or tenderness.     Rectum: Normal. No tenderness (no lesions or discharge).     Comments: Penile Discharge Amount: none Color:  none Lymphadenopathy:     Head:     Right side of head: No preauricular or posterior auricular adenopathy.     Left side of head: No preauricular or posterior auricular adenopathy.     Cervical: No cervical adenopathy.     Upper Body:     Right upper body: No supraclavicular, axillary or epitrochlear adenopathy.     Left upper body: No supraclavicular, axillary or epitrochlear adenopathy.     Lower Body: No right inguinal adenopathy. No left inguinal adenopathy.  Skin:    General: Skin is warm and dry.     Findings: No lesion or rash.   Neurological:     Mental Status: He is alert and oriented to person, place, and time.     Assessment and Plan:  Lucas Moore is a 33 y.o. male presenting to the [facility] Department for STI screening  1. Screening for venereal disease (Primary)  - HIV [place] [facility] - Syphilis Serology, [place] [facility] - Chlamydia/GC NAA, Confirmation - Gram stain   Patient does have STI symptoms Patient accepted all screenings including  urine GC/Chlamydia, and blood work for HIV/Syphilis. Patient meets criteria for HepB screening? No. Ordered? not applicable Patient meets criteria for HepC screening? No. Ordered? not applicable Recommended condom use with all sex Discussed importance of condom use for STI prevention  Treat positive test results per standing order. Discussed time line for [facility] results and that patient will be called with positive results and encouraged patient to call if he had not heard in 2 weeks Recommended repeat testing in 3 months with positive results. Recommended returning for continued or worsening symptoms.   Return if symptoms worsen or fail to improve, for STI screening.  No future appointments.  Lenice Llamas, [place]

## 2023-10-07 NOTE — Progress Notes (Deleted)
Pt is here for STD screening. Condoms given and Pt given the opportunity to ask questions for any clarifications. Sonda Primes, RN.

## 2023-10-07 NOTE — Progress Notes (Signed)
Pt is here for STD screening.  Gram stain results reviewed. The patient was dispensed Doxycycline 100 mg #14 today. I provided counseling today regarding the medication. We discussed the medication, the side effects and when to call clinic. Patient given the opportunity to ask questions. Questions answered.  Condoms given.  Berdie Ogren, RN

## 2023-10-07 NOTE — Addendum Note (Signed)
Addended by: Berdie Ogren on: 10/07/2023 12:11 PM   Modules accepted: Orders

## 2023-10-10 LAB — CHLAMYDIA/GC NAA, CONFIRMATION
Chlamydia trachomatis, NAA: NEGATIVE
Neisseria gonorrhoeae, NAA: NEGATIVE

## 2023-10-31 ENCOUNTER — Ambulatory Visit

## 2023-11-11 ENCOUNTER — Ambulatory Visit

## 2023-11-14 ENCOUNTER — Ambulatory Visit

## 2023-11-14 ENCOUNTER — Encounter: Payer: Self-pay | Admitting: Nurse Practitioner

## 2023-11-14 ENCOUNTER — Ambulatory Visit: Admitting: Nurse Practitioner

## 2023-11-14 DIAGNOSIS — Z113 Encounter for screening for infections with a predominantly sexual mode of transmission: Secondary | ICD-10-CM

## 2023-11-14 LAB — HEPATITIS B SURFACE ANTIGEN

## 2023-11-14 LAB — GRAM STAIN

## 2023-11-14 LAB — HM HEPATITIS C SCREENING LAB: HM Hepatitis Screen: NEGATIVE

## 2023-11-14 LAB — HM HIV SCREENING LAB: HM HIV Screening: NEGATIVE

## 2023-11-14 NOTE — Progress Notes (Signed)
 Pt is here for STD screening , gram stain results reviewed with pt, no treatment required per standing order. Condoms Given.Sonda Primes, RN.

## 2023-11-18 LAB — CHLAMYDIA/GC NAA, CONFIRMATION
Chlamydia trachomatis, NAA: NEGATIVE
Neisseria gonorrhoeae, NAA: NEGATIVE

## 2023-11-20 LAB — GONOCOCCUS CULTURE

## 2023-11-23 NOTE — Progress Notes (Signed)
 [facility] Department STI clinic [address] [place] [address] Main phone: [phone]  STI screening visit  Subjective:  Lucas Moore is a 33 y.o. male being seen today for an STI screening visit. The patient reports they do not have symptoms.    Patient has the following medical conditions:  There are no active problems to display for this patient.   Chief Complaint  Patient presents with   SEXUALLY TRANSMITTED DISEASE   Patient is a pleasant 33 y.o. male who presents to the office today requesting asymptomatic STI testing. He reports 2 male partners in the last 2 months, and practices penile/vaginal penetrative sex and oral sex. Patient reports  using condoms sometimes. Patient indicates a history of gonorrhea and chlamydia in 2021. He reports last sex was 2 months ago. He is requesting penile gram stain today.    STI screening history: Last HIV test per patient/review of record was  Lab Results  Component Value Date   HMHIVSCREEN Negative - Validated 11/14/2023   Last HEPC test per patient/review of record was  Lab Results  Component Value Date   HMHEPCSCREEN Negative-Validated 11/14/2023    Last HEPB test per patient/review of record was No components found for: "HMHEPBSCREEN"   Fertility: Does the patient or their partner desires a pregnancy in the next year? No  Screening for MPX risk: Does the patient have an unexplained rash? No Is the patient MSM? No Does the patient endorse multiple sex partners or anonymous sex partners? Yes Did the patient have close or sexual contact with a person diagnosed with MPX? No Has the patient traveled outside the [place] where MPX is endemic? No Is there a high clinical suspicion for MPX-- evidenced by one of the following No  -Unlikely to be chickenpox  -Lymphadenopathy  -Rash that present in same phase of evolution on any given body part   See flowsheet for further details and  programmatic requirements.    There is no immunization history on file for this patient.   The following portions of the patient's history were reviewed and updated as appropriate: allergies, current medications, past medical history, past social history, past surgical history and problem list.  Objective:  There were no vitals filed for this visit.  Physical Exam Nursing note reviewed. Chaperone present: Declined.  Constitutional:      Appearance: Normal appearance.  HENT:     Head: Normocephalic.     Salivary Glands: Right salivary gland is not diffusely enlarged or tender. Left salivary gland is not diffusely enlarged or tender.     Mouth/Throat:     Lips: Pink. No lesions.     Mouth: Mucous membranes are moist. No oral lesions.     Tongue: No lesions. Tongue does not deviate from midline.     Pharynx: Oropharynx is clear. Uvula midline. No oropharyngeal exudate or posterior oropharyngeal erythema.     Tonsils: No tonsillar exudate.  Eyes:     General:        Right eye: No discharge.        Left eye: No discharge.     Conjunctiva/sclera:     Right eye: Right conjunctiva is not injected. No exudate.    Left eye: Left conjunctiva is not injected. No exudate. Pulmonary:     Effort: Pulmonary effort is normal.  Genitourinary:    Pubic Area: No rash or pubic lice.      Penis: Normal. No tenderness, discharge, swelling or lesions.  Testes: Normal.     Epididymis:     Right: Normal. No mass or tenderness.     Left: Normal. No mass or tenderness.     Tanner stage (genital): 5.  Lymphadenopathy:     Head:     Right side of head: No submental, submandibular, tonsillar, preauricular or posterior auricular adenopathy.     Left side of head: No submental, submandibular, tonsillar, preauricular or posterior auricular adenopathy.     Cervical: No cervical adenopathy.     Right cervical: No superficial or posterior cervical adenopathy.    Left cervical: No superficial or  posterior cervical adenopathy.     Upper Body:     Right upper body: No supraclavicular or axillary adenopathy.     Left upper body: No supraclavicular or axillary adenopathy.     Lower Body: No right inguinal adenopathy. No left inguinal adenopathy.  Skin:    General: Skin is warm and dry.     Findings: No lesion or rash.     Comments: Skin tone appropriate for ethnicity.   Neurological:     Mental Status: He is alert and oriented to person, place, and time.  Psychiatric:        Attention and Perception: Attention and perception normal.        Mood and Affect: Mood and affect normal.        Speech: Speech normal.        Behavior: Behavior normal. Behavior is cooperative.        Thought Content: Thought content normal.     Assessment and Plan:  TYVON EGGENBERGER is a 33 y.o. male presenting to the [facility] Department for STI screening  1. Screening for venereal disease (Primary) Gram stain performed per patient request. Negative in office.  - Gonococcus culture - Gram stain - HBV Antigen/Antibody State Lab - HIV/HCV  Lab - Chlamydia/GC NAA, Confirmation   Patient does not have STI symptoms Patient accepted all screenings including  urine GC/Chlamydia, and blood work for HIV. Patient meets criteria for HepB screening? Yes. Ordered? yes Patient meets criteria for HepC screening? Yes. Ordered? yes Recommended condom use with all sex Discussed importance of condom use for STI prevention  Treat positive test results per standing order. Discussed time line for State Lab results and that patient will be called with positive results and encouraged patient to call if he had not heard in 2 weeks Recommended repeat testing in 3 months with positive results. Recommended returning for continued or worsening symptoms.   Return if symptoms worsen or fail to improve.  No future appointments.  Total time with patient 16 minutes.   Edmonia James, NP

## 2024-04-13 ENCOUNTER — Ambulatory Visit

## 2024-04-13 DIAGNOSIS — Z113 Encounter for screening for infections with a predominantly sexual mode of transmission: Secondary | ICD-10-CM | POA: Diagnosis not present

## 2024-04-13 LAB — HM HIV SCREENING LAB: HM HIV Screening: NEGATIVE

## 2024-04-13 NOTE — Progress Notes (Signed)
 Pt is here for STD visit. Condoms given. Wilkie Drought, RN.

## 2024-04-13 NOTE — Progress Notes (Signed)
 Lucas Moore [address] [place] [address] Main phone: [phone]  STI screening visit  Subjective:  Lucas Moore is a 33 y.o. male being seen today for an STI screening visit. The patient reports they do not have symptoms.    Patient has the following medical conditions:  There are no active problems to display for this patient.  Chief Complaint  Patient presents with   SEXUALLY TRANSMITTED DISEASE    STI screening-no symptoms   HPI Patient reports desire for asymptomatic STI testing. No contacts or concerns. Reports history of high blood pressure and would like PCP referral.  See flowsheet for further details and programmatic requirements  Hyperlink available at the top of the signed note in blue.  Flow sheet content below:  Pregnancy Intention Screening Does the patient want to become pregnant in the next year?: No Does the patient's partner want to become pregnant in the next year?: No Would the patient like to discuss contraceptive options today?: No Reason For STD Screen STD Screening: Is asymptomatic Have you ever had an STD?: Yes History of Antibiotic use in the past 2 weeks?: No STD Symptoms Denies all: Yes Risk Factors for Hep B Household, sexual, or needle sharing contact of a person infected with Hep B: No Sexual contact with a person who uses drugs not as prescribed?: No Currently or Ever used drugs not as prescribed: No HIV Positive: No PRep Patient: No Men who have sex with men: No Have Hepatitis C: No History of Incarceration: No History of Homeslessness?: No Anal sex following anal drug use?: No Risk Factors for Hep C Currently using drugs not as prescribed: No Sexual partner(s) currently using drugs as not prescribed: No History of drug use: No HIV Positive: No People with a history of incarceration: No People born between the years of 69 and 29: No Abuse History Has patient  ever been abused physically?: No Has patient ever been abused sexually?: No Does patient feel they have a problem with Anxiety?: No Does patient feel they have a problem with Depression?: No Counseling Patient counseled to use condoms with all sex: Condoms given Test results given to patient Patient counseled to use condoms with all sex: Condoms given  Screening for MPX risk: Does the patient have an unexplained rash? No Is the patient MSM? No Does the patient endorse multiple sex partners or anonymous sex partners? No Did the patient have close or sexual contact with a person diagnosed with MPX? No Has the patient traveled outside the [place]  where MPX is endemic? No Is there a high clinical suspicion for MPX-- evidenced by one of the following No  -Unlikely to be chickenpox  -Lymphadenopathy  -Rash that present in same phase of evolution on any given body part  STI screening history: Last HIV test per patient/review of record was  Lab Results  Component Value Date   HMHIVSCREEN Negative - Validated 11/14/2023   No results found for: HIV  Last HEPC test per patient/review of record was  Lab Results  Component Value Date   HMHEPCSCREEN Negative-Validated 11/14/2023   No components found for: HEPC   Last HEPB test per patient/review of record was No components found for: HMHEPBSCREEN   Fertility: Does the patient or their partner desires a pregnancy in the next year? No   There is no immunization history on file for this patient.  The following portions of the patient's history were reviewed and updated as appropriate:  allergies, current medications, past medical history, past social history, past surgical history and problem list.  Objective:  There were no vitals filed for this visit.  Physical Exam Constitutional:      Appearance: Normal appearance.  HENT:     Head: Normocephalic.     Mouth/Throat:     Lips: Pink. No lesions.     Mouth: Mucous membranes are  moist.     Pharynx: Oropharynx is clear. No pharyngeal swelling or oropharyngeal exudate.     Tonsils: No tonsillar exudate.  Eyes:     General: No scleral icterus.       Right eye: No discharge.        Left eye: No discharge.  Pulmonary:     Effort: Pulmonary effort is normal.  Lymphadenopathy:     Head:     Right side of head: No submental, submandibular, tonsillar, preauricular or posterior auricular adenopathy.     Left side of head: No submental, submandibular, tonsillar, preauricular or posterior auricular adenopathy.     Cervical: No cervical adenopathy.     Right cervical: No superficial or posterior cervical adenopathy.    Left cervical: No superficial or posterior cervical adenopathy.  Skin:    General: Skin is warm and dry.  Neurological:     General: No focal deficit present.     Mental Status: He is alert.  Psychiatric:        Mood and Affect: Mood normal.        Behavior: Behavior normal.    Assessment and Plan:  Lucas Moore is a 33 y.o. male presenting to the [facility] Department for STI screening  1. Screening for venereal disease (Primary)  - WET PREP FOR TRICH, YEAST, CLUE - [facility] - Syphilis Serology,  Lab - Chlamydia/GC NAA, Confirmation  Patient does not have STI symptoms Patient accepted the following screenings: HIV and RPR, throat gc, urine g/c Patient meets criteria for HepB screening? No. Immunity demonstrated 11/14/23 Patient meets criteria for HepC screening? No. Ordered? no Recommended condom use with all sex Discussed importance of condom use for STI prevention  Treat positive test results per standing order. Discussed time line for State Lab results and that patient will be called with positive results and encouraged patient to call if he had not heard in 2 weeks Recommended repeat testing in 3 months with positive results. Recommended returning for continued or worsening symptoms.   PCP list  provided.  Return in about 3 months (around 07/14/2024).  No future appointments.  Damien FORBES Satchel, NP

## 2024-04-16 LAB — CHLAMYDIA/GC NAA, CONFIRMATION
Chlamydia trachomatis, NAA: NEGATIVE
Neisseria gonorrhoeae, NAA: NEGATIVE

## 2024-04-17 LAB — GONOCOCCUS CULTURE

## 2024-04-21 ENCOUNTER — Encounter: Payer: Self-pay | Admitting: Family Medicine

## 2024-06-21 ENCOUNTER — Emergency Department
Admission: EM | Admit: 2024-06-21 | Discharge: 2024-06-21 | Disposition: A | Discharge status: Home / Self Care | Attending: Emergency Medicine | Admitting: Emergency Medicine

## 2024-06-21 ENCOUNTER — Other Ambulatory Visit: Payer: Self-pay

## 2024-06-21 ENCOUNTER — Emergency Department

## 2024-06-21 DIAGNOSIS — S93402A Sprain of unspecified ligament of left ankle, initial encounter: Secondary | ICD-10-CM | POA: Diagnosis not present

## 2024-06-21 DIAGNOSIS — S99912A Unspecified injury of left ankle, initial encounter: Secondary | ICD-10-CM | POA: Diagnosis present

## 2024-06-21 DIAGNOSIS — X501XXA Overexertion from prolonged static or awkward postures, initial encounter: Secondary | ICD-10-CM | POA: Insufficient documentation

## 2024-06-21 MED ORDER — IBUPROFEN 600 MG PO TABS
600.0000 mg | ORAL_TABLET | Freq: Three times a day (TID) | ORAL | 0 refills | Status: AC | PRN
Start: 2024-06-21 — End: ?

## 2024-06-21 NOTE — ED Provider Notes (Signed)
 [facility] Provider Note    Event Date/Time   First MD Initiated Contact with Patient 06/21/24 1055     (approximate)   History   Ankle Pain   HPI  Lucas Moore is a 33 y.o. male   presents to the ED with complaint of left ankle pain that started yesterday without known history of recent injury.  Patient states he may have twisted his ankle several days ago but this morning pain was increased and he has swelling.  Patient has been taking Tylenol  without any relief.  Patient denies any previous trauma to his ankle.      Physical Exam   Triage Vital Signs: ED Triage Vitals  Encounter Vitals Group     BP 06/21/24 1045 (!) 153/96     Girls Systolic BP Percentile --      Girls Diastolic BP Percentile --      Boys Systolic BP Percentile --      Boys Diastolic BP Percentile --      Pulse Rate 06/21/24 1045 88     Resp 06/21/24 1045 18     Temp 06/21/24 1045 98.4 F (36.9 C)     Temp Source 06/21/24 1045 Oral     SpO2 06/21/24 1045 100 %     Weight 06/21/24 1045 199 lb 15.3 oz (90.7 kg)     Height 06/21/24 1045 5' 7 (1.702 m)     Head Circumference --      Peak Flow --      Pain Score 06/21/24 1044 7     Pain Loc --      Pain Education --      Exclude from Growth Chart --     Most recent vital signs: Vitals:   06/21/24 1045  BP: (!) 153/96  Pulse: 88  Resp: 18  Temp: 98.4 F (36.9 C)  SpO2: 100%     General: Awake, no distress.  CV:  Good peripheral perfusion.  Resp:  Normal effort.  Abd:  No distention.  Other:  Left ankle with soft tissue edema noted to the lateral aspect of the lateral malleolus.  Tender to palpation bilaterally.  Skin is intact.  Pulses are present both PT and DP.  Motor or sensory function intact distal to the injury.   ED Results / Procedures / Treatments   Labs (all labs ordered are listed, but only abnormal results are displayed) Labs Reviewed - No data to display    RADIOLOGY Left ankle x-ray  images were reviewed and interpreted by myself independent of the radiologist and was negative for fracture or dislocation.    PROCEDURES:  Critical Care performed:   Procedures   MEDICATIONS ORDERED IN ED: Medications - No data to display   IMPRESSION / MDM / ASSESSMENT AND PLAN / ED COURSE  I reviewed the triage vital signs and the nursing notes.   Differential diagnosis includes, but is not limited to, left ankle sprain, dislocation, fracture, internal derangement with ligament injury.  33 year old male presents to the ED with complaint of left ankle pain that started yesterday morning.  Patient did have a sprain several days ago which did not bother him at that time.  He has been taking Tylenol  without any improvement.  X-rays were reassuring and patient was made aware.  He was placed in a cam walker boot and a prescription for ibuprofen was sent to the pharmacy.  He is to follow-up with Dr. Malvin who is on-call for  podiatry if he continues to have any problems with his ankle.      Patient's presentation is most consistent with acute complicated illness / injury requiring diagnostic workup.  FINAL CLINICAL IMPRESSION(S) / ED DIAGNOSES   Final diagnoses:  Sprain of left ankle, unspecified ligament, initial encounter     Rx / DC Orders   ED Discharge Orders          Ordered    ibuprofen (ADVIL) 600 MG tablet  Every 8 hours PRN        06/21/24 1334             Note:  This document was prepared using Dragon voice recognition software and may include unintentional dictation errors.   Saunders Shona CROME, PA-C 06/21/24 1350    Arlander Charleston, MD 06/21/24 1500

## 2024-06-21 NOTE — ED Triage Notes (Signed)
 Pt presents to the ED via POV from home with left ankle pain that started yesterday morning. Pt denies injury or trauma. Reports swelling on inner aspect of left ankle.

## 2024-06-21 NOTE — Discharge Instructions (Signed)
 Call make an appointment with Dr. Malvin who is on-call for podiatry.  This would be the person to take care of your ankle if you are not improving or continue to have problems with your ankle.  A cam walker boot is to be worn anytime you are up walking.  Ice and elevation as needed for swelling.  A prescription for ibuprofen 600 mg 3 times daily with food was sent to your pharmacy.
